# Patient Record
Sex: Male | Born: 1945 | Race: Black or African American | Hispanic: No | Marital: Single | Smoking: Never smoker
Health system: Southern US, Community
[De-identification: ages and names within clinical notes are randomized; demographics above are authoritative.]

## PROBLEM LIST (undated history)

## (undated) DIAGNOSIS — N529 Male erectile dysfunction, unspecified: Secondary | ICD-10-CM

## (undated) DIAGNOSIS — E119 Type 2 diabetes mellitus without complications: Secondary | ICD-10-CM

## (undated) DIAGNOSIS — E785 Hyperlipidemia, unspecified: Secondary | ICD-10-CM

## (undated) DIAGNOSIS — I10 Essential (primary) hypertension: Secondary | ICD-10-CM

## (undated) DIAGNOSIS — N183 Chronic kidney disease, stage 3 unspecified: Secondary | ICD-10-CM

## (undated) DIAGNOSIS — D649 Anemia, unspecified: Secondary | ICD-10-CM

## (undated) HISTORY — DX: Chronic kidney disease, stage 3 unspecified: N18.30

## (undated) HISTORY — DX: Anemia, unspecified: D64.9

## (undated) HISTORY — DX: Essential (primary) hypertension: I10

## (undated) HISTORY — DX: Male erectile dysfunction, unspecified: N52.9

## (undated) HISTORY — DX: Type 2 diabetes mellitus without complications: E11.9

## (undated) HISTORY — DX: Hyperlipidemia, unspecified: E78.5

## (undated) HISTORY — PX: PROSTATE SURGERY: SHX751

## (undated) HISTORY — DX: Hypercalcemia: E83.52

---

## 2008-03-26 ENCOUNTER — Ambulatory Visit: Payer: Self-pay | Admitting: Internal Medicine

## 2008-04-08 ENCOUNTER — Ambulatory Visit: Payer: Self-pay | Admitting: Internal Medicine

## 2012-05-14 DIAGNOSIS — D631 Anemia in chronic kidney disease: Secondary | ICD-10-CM | POA: Insufficient documentation

## 2012-05-14 DIAGNOSIS — D649 Anemia, unspecified: Secondary | ICD-10-CM | POA: Insufficient documentation

## 2012-12-04 ENCOUNTER — Emergency Department: Payer: Self-pay | Admitting: Emergency Medicine

## 2012-12-09 ENCOUNTER — Emergency Department: Payer: Self-pay | Admitting: Emergency Medicine

## 2012-12-09 LAB — BASIC METABOLIC PANEL
Anion Gap: 8 (ref 7–16)
Co2: 24 mmol/L (ref 21–32)
Creatinine: 2.97 mg/dL — ABNORMAL HIGH (ref 0.60–1.30)
EGFR (Non-African Amer.): 21 — ABNORMAL LOW
Potassium: 4.3 mmol/L (ref 3.5–5.1)
Sodium: 139 mmol/L (ref 136–145)

## 2012-12-09 LAB — CBC WITH DIFFERENTIAL/PLATELET
Basophil #: 0 10*3/uL (ref 0.0–0.1)
Eosinophil %: 1.2 %
HCT: 31.4 % — ABNORMAL LOW (ref 40.0–52.0)
HGB: 10.5 g/dL — ABNORMAL LOW (ref 13.0–18.0)
Lymphocyte #: 1 10*3/uL (ref 1.0–3.6)
Lymphocyte %: 24.3 %
MCH: 29.9 pg (ref 26.0–34.0)
Monocyte #: 0.3 x10 3/mm (ref 0.2–1.0)
Neutrophil %: 64.6 %
Platelet: 230 10*3/uL (ref 150–440)
RBC: 3.51 10*6/uL — ABNORMAL LOW (ref 4.40–5.90)
WBC: 4 10*3/uL (ref 3.8–10.6)

## 2012-12-09 LAB — URINALYSIS, COMPLETE
Bilirubin,UR: NEGATIVE
Blood: NEGATIVE
Glucose,UR: NEGATIVE mg/dL (ref 0–75)
Ketone: NEGATIVE
Nitrite: NEGATIVE
Protein: NEGATIVE
RBC,UR: 1 /HPF (ref 0–5)
WBC UR: 2 /HPF (ref 0–5)

## 2012-12-20 ENCOUNTER — Emergency Department: Payer: Self-pay | Admitting: Emergency Medicine
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
## 2019-12-26 ENCOUNTER — Ambulatory Visit: Payer: Medicare Other | Admitting: Gastroenterology

## 2020-01-30 ENCOUNTER — Encounter: Payer: Self-pay | Admitting: Gastroenterology

## 2020-01-30 ENCOUNTER — Other Ambulatory Visit: Payer: Self-pay

## 2020-01-30 ENCOUNTER — Ambulatory Visit (INDEPENDENT_AMBULATORY_CARE_PROVIDER_SITE_OTHER): Payer: Medicare Other | Admitting: Gastroenterology

## 2020-01-30 VITALS — BP 152/78 | HR 75 | Temp 98.2°F | Wt 152.4 lb

## 2020-01-30 DIAGNOSIS — I10 Essential (primary) hypertension: Secondary | ICD-10-CM | POA: Insufficient documentation

## 2020-01-30 DIAGNOSIS — E119 Type 2 diabetes mellitus without complications: Secondary | ICD-10-CM | POA: Insufficient documentation

## 2020-01-30 DIAGNOSIS — N183 Chronic kidney disease, stage 3 unspecified: Secondary | ICD-10-CM | POA: Insufficient documentation

## 2020-01-30 DIAGNOSIS — D500 Iron deficiency anemia secondary to blood loss (chronic): Secondary | ICD-10-CM | POA: Diagnosis not present

## 2020-01-30 NOTE — Progress Notes (Signed)
Gastroenterology Consultation  Referring Provider:     Casilda Carls, MD Primary Care Physician:  Casilda Carls, MD Primary Gastroenterologist:  Dr. Allen Norris     Reason for Consultation:     Iron deficiency anemia        HPI:   Donald Davis is a 74 y.o. y/o male referred for consultation & management of iron deficiency anemia by Dr. Casilda Carls, MD.  This patient comes in today after being sent by his primary care physician with a diagnosis of iron deficiency anemia.  It was also stated in the record that the patient had his last colonoscopy in 2013 and at that time was recommended to have a repeat colonoscopy in 5 years.  It is unclear what the last colonoscopy showed since it was done at an outside facility without any records in the chart.  In January of this year the patient's hemoglobin was 11.8 which is slightly lower than the lower limit of normal.  The patient denies any unexplained weight loss fevers chills nausea vomiting black stools or bloody stools.  He reports that he has been in general good health.  Past Medical History:  Diagnosis Date  . Anemia   . Chronic kidney disease (CKD), stage III (moderate)   . Diabetes mellitus ([place])   . Erectile dysfunction   . Hypercalcemia   . Hyperlipidemia   . Hypertension     Past Surgical History:  Procedure Laterality Date  . PROSTATE SURGERY      Prior to Admission medications   Not on File    Family History  Problem Relation Age of Onset  . Cancer Father      Social History   Tobacco Use  . Smoking status: Never Smoker  . Smokeless tobacco: Never Used  Substance Use Topics  . Alcohol use: Never  . Drug use: Never    Allergies as of 01/30/2020 - Review Complete 01/30/2020  Allergen Reaction Noted  . [place] grass extract  10/14/2013  . Hydrochlorothiazide  11/23/2015    Review of Systems:    All systems reviewed and negative except where noted in HPI.   Physical Exam:  BP (!) 152/78   Pulse 75    Temp 98.2 F (36.8 C) (Oral)   Wt 152 lb 6.4 oz (69.1 kg)  No LMP for male patient. General:   Alert,  Well-developed, well-nourished, pleasant and cooperative in NAD Head:  Normocephalic and atraumatic. Eyes:  Sclera clear, no icterus.   Conjunctiva pink. Ears:  Normal auditory acuity. Neck:  Supple; no masses or thyromegaly. Lungs:  Respirations even and unlabored.  Clear throughout to auscultation.   No wheezes, crackles, or rhonchi. No acute distress. Heart:  Regular rate and rhythm; no murmurs, clicks, rubs, or gallops. Abdomen:  Normal bowel sounds.  No bruits.  Soft, non-tender and non-distended without masses, hepatosplenomegaly or hernias noted.  No guarding or rebound tenderness.  Negative Carnett sign.   Rectal:  Deferred.  Pulses:  Normal pulses noted. Extremities:  No clubbing or edema.  No cyanosis. Neurologic:  Alert and oriented x3;  grossly normal neurologically. Skin:  Intact without significant lesions or rashes.  No jaundice. Lymph Nodes:  No significant cervical adenopathy. Psych:  Alert and cooperative. Normal mood and affect.  Imaging Studies: No results found.  Assessment and Plan:   Donald Davis is a 74 y.o. y/o male who comes in with a report of iron deficiency anemia.  The patient was reported to have had a colonoscopy  in 2013 and was recommended to have a repeat colonoscopy in 5 years.  The patient denies having polyps or any family history of colon cancer or colon polyps in the past and he is not sure why he was recommended to have a repeat colonoscopy in 5 years.  That being said for the anemia work-up the patient will be set up for an EGD and colonoscopy.  The patient has been explained the plan and agrees with it.    Lucilla Lame, MD. Marval Regal    Note: This dictation was prepared with Dragon dictation along with smaller phrase technology. Any transcriptional errors that result from this process are unintentional.

## 2020-01-30 NOTE — H&P (View-Only) (Signed)
Gastroenterology Consultation  Referring Provider:     Casilda Carls, MD Primary Care Physician:  Casilda Carls, MD Primary Gastroenterologist:  Dr. Allen Norris     Reason for Consultation:     Iron deficiency anemia        HPI:   Donald Davis is a 74 y.o. y/o male referred for consultation & management of iron deficiency anemia by Dr. Casilda Carls, MD.  This patient comes in today after being sent by his primary care physician with a diagnosis of iron deficiency anemia.  It was also stated in the record that the patient had his last colonoscopy in 2013 and at that time was recommended to have a repeat colonoscopy in 5 years.  It is unclear what the last colonoscopy showed since it was done at an outside facility without any records in the chart.  In January of this year the patient's hemoglobin was 11.8 which is slightly lower than the lower limit of normal.  The patient denies any unexplained weight loss fevers chills nausea vomiting black stools or bloody stools.  He reports that he has been in general good health.  Past Medical History:  Diagnosis Date  . Anemia   . Chronic kidney disease (CKD), stage III (moderate)   . Diabetes mellitus ([place])   . Erectile dysfunction   . Hypercalcemia   . Hyperlipidemia   . Hypertension     Past Surgical History:  Procedure Laterality Date  . PROSTATE SURGERY      Prior to Admission medications   Not on File    Family History  Problem Relation Age of Onset  . Cancer Father      Social History   Tobacco Use  . Smoking status: Never Smoker  . Smokeless tobacco: Never Used  Substance Use Topics  . Alcohol use: Never  . Drug use: Never    Allergies as of 01/30/2020 - Review Complete 01/30/2020  Allergen Reaction Noted  . [place] grass extract  10/14/2013  . Hydrochlorothiazide  11/23/2015    Review of Systems:    All systems reviewed and negative except where noted in HPI.   Physical Exam:  BP (!) 152/78   Pulse 75    Temp 98.2 F (36.8 C) (Oral)   Wt 152 lb 6.4 oz (69.1 kg)  No LMP for male patient. General:   Alert,  Well-developed, well-nourished, pleasant and cooperative in NAD Head:  Normocephalic and atraumatic. Eyes:  Sclera clear, no icterus.   Conjunctiva pink. Ears:  Normal auditory acuity. Neck:  Supple; no masses or thyromegaly. Lungs:  Respirations even and unlabored.  Clear throughout to auscultation.   No wheezes, crackles, or rhonchi. No acute distress. Heart:  Regular rate and rhythm; no murmurs, clicks, rubs, or gallops. Abdomen:  Normal bowel sounds.  No bruits.  Soft, non-tender and non-distended without masses, hepatosplenomegaly or hernias noted.  No guarding or rebound tenderness.  Negative Carnett sign.   Rectal:  Deferred.  Pulses:  Normal pulses noted. Extremities:  No clubbing or edema.  No cyanosis. Neurologic:  Alert and oriented x3;  grossly normal neurologically. Skin:  Intact without significant lesions or rashes.  No jaundice. Lymph Nodes:  No significant cervical adenopathy. Psych:  Alert and cooperative. Normal mood and affect.  Imaging Studies: No results found.  Assessment and Plan:   Donald Davis is a 74 y.o. y/o male who comes in with a report of iron deficiency anemia.  The patient was reported to have had a colonoscopy  in 2013 and was recommended to have a repeat colonoscopy in 5 years.  The patient denies having polyps or any family history of colon cancer or colon polyps in the past and he is not sure why he was recommended to have a repeat colonoscopy in 5 years.  That being said for the anemia work-up the patient will be set up for an EGD and colonoscopy.  The patient has been explained the plan and agrees with it.    Lucilla Lame, MD. Marval Regal    Note: This dictation was prepared with Dragon dictation along with smaller phrase technology. Any transcriptional errors that result from this process are unintentional.

## 2020-02-03 ENCOUNTER — Other Ambulatory Visit: Payer: Self-pay

## 2020-02-03 MED ORDER — SUPREP BOWEL PREP KIT 17.5-3.13-1.6 GM/177ML PO SOLN: 1.0000 | ORAL | 0 refills | Status: DC

## 2020-02-12 ENCOUNTER — Other Ambulatory Visit
Admission: RE | Admit: 2020-02-12 | Discharge: 2020-02-12 | Disposition: A | Discharge status: Home / Self Care | Payer: Medicare Other | Source: Ambulatory Visit | Attending: Gastroenterology | Admitting: Gastroenterology

## 2020-02-12 ENCOUNTER — Other Ambulatory Visit: Payer: Self-pay

## 2020-02-12 DIAGNOSIS — Z20822 Contact with and (suspected) exposure to covid-19: Secondary | ICD-10-CM | POA: Diagnosis not present

## 2020-02-12 DIAGNOSIS — Z01812 Encounter for preprocedural laboratory examination: Secondary | ICD-10-CM | POA: Insufficient documentation

## 2020-02-12 LAB — SARS CORONAVIRUS 2 (TAT 6-24 HRS): SARS Coronavirus 2: NEGATIVE

## 2020-02-14 ENCOUNTER — Ambulatory Visit
Admission: RE | Admit: 2020-02-14 | Discharge: 2020-02-14 | Disposition: A | Discharge status: Home / Self Care | Payer: Medicare Other | Attending: Gastroenterology | Admitting: Gastroenterology

## 2020-02-14 ENCOUNTER — Other Ambulatory Visit: Payer: Self-pay

## 2020-02-14 ENCOUNTER — Ambulatory Visit: Payer: Medicare Other | Admitting: Anesthesiology

## 2020-02-14 ENCOUNTER — Encounter
Admission: RE | Disposition: A | Discharge status: Home / Self Care | Payer: Self-pay | Source: Home / Self Care | Attending: Gastroenterology

## 2020-02-14 ENCOUNTER — Encounter: Payer: Self-pay | Admitting: Gastroenterology

## 2020-02-14 DIAGNOSIS — Z888 Allergy status to other drugs, medicaments and biological substances status: Secondary | ICD-10-CM | POA: Insufficient documentation

## 2020-02-14 DIAGNOSIS — K2900 Acute gastritis without bleeding: Secondary | ICD-10-CM | POA: Diagnosis not present

## 2020-02-14 DIAGNOSIS — D122 Benign neoplasm of ascending colon: Secondary | ICD-10-CM | POA: Insufficient documentation

## 2020-02-14 DIAGNOSIS — K2960 Other gastritis without bleeding: Secondary | ICD-10-CM | POA: Insufficient documentation

## 2020-02-14 DIAGNOSIS — D509 Iron deficiency anemia, unspecified: Secondary | ICD-10-CM | POA: Diagnosis present

## 2020-02-14 DIAGNOSIS — N529 Male erectile dysfunction, unspecified: Secondary | ICD-10-CM | POA: Insufficient documentation

## 2020-02-14 DIAGNOSIS — E785 Hyperlipidemia, unspecified: Secondary | ICD-10-CM | POA: Insufficient documentation

## 2020-02-14 DIAGNOSIS — N183 Chronic kidney disease, stage 3 unspecified: Secondary | ICD-10-CM | POA: Insufficient documentation

## 2020-02-14 DIAGNOSIS — K319 Disease of stomach and duodenum, unspecified: Secondary | ICD-10-CM | POA: Insufficient documentation

## 2020-02-14 DIAGNOSIS — K635 Polyp of colon: Secondary | ICD-10-CM | POA: Diagnosis not present

## 2020-02-14 DIAGNOSIS — E1122 Type 2 diabetes mellitus with diabetic chronic kidney disease: Secondary | ICD-10-CM | POA: Insufficient documentation

## 2020-02-14 DIAGNOSIS — I129 Hypertensive chronic kidney disease with stage 1 through stage 4 chronic kidney disease, or unspecified chronic kidney disease: Secondary | ICD-10-CM | POA: Diagnosis not present

## 2020-02-14 DIAGNOSIS — D500 Iron deficiency anemia secondary to blood loss (chronic): Secondary | ICD-10-CM

## 2020-02-14 HISTORY — PX: COLONOSCOPY WITH PROPOFOL: SHX5780

## 2020-02-14 HISTORY — PX: ESOPHAGOGASTRODUODENOSCOPY (EGD) WITH PROPOFOL: SHX5813

## 2020-02-14 SURGERY — COLONOSCOPY WITH PROPOFOL
Anesthesia: General

## 2020-02-14 MED ORDER — PROPOFOL 500 MG/50ML IV EMUL
INTRAVENOUS | Status: DC | PRN
  Administered 2020-02-14: 150 ug/kg/min via INTRAVENOUS

## 2020-02-14 MED ORDER — PROPOFOL 10 MG/ML IV BOLUS
INTRAVENOUS | Status: DC | PRN
  Administered 2020-02-14: 100 mg via INTRAVENOUS

## 2020-02-14 MED ORDER — SODIUM CHLORIDE 0.9 % IV SOLN
INTRAVENOUS | Status: DC
  Administered 2020-02-14: 1000 mL via INTRAVENOUS

## 2020-02-14 MED ORDER — LIDOCAINE HCL (PF) 2 % IJ SOLN
INTRAMUSCULAR | Status: AC
  Filled 2020-02-14: qty 5

## 2020-02-14 MED ORDER — PANTOPRAZOLE SODIUM 40 MG PO TBEC
40.0000 mg | DELAYED_RELEASE_TABLET | Freq: Every day | ORAL | 1 refills | Status: DC
Start: 2020-02-14 — End: 2020-02-17

## 2020-02-14 MED ORDER — LIDOCAINE HCL (CARDIAC) PF 100 MG/5ML IV SOSY
PREFILLED_SYRINGE | INTRAVENOUS | Status: DC | PRN
  Administered 2020-02-14: 50 mg via INTRAVENOUS

## 2020-02-14 MED ORDER — PROPOFOL 500 MG/50ML IV EMUL
INTRAVENOUS | Status: AC
  Filled 2020-02-14: qty 50

## 2020-02-14 NOTE — Op Note (Addendum)
[facility] Gastroenterology Patient Name: Donald Davis Procedure Date: 02/14/2020 10:36 AM MRN: [id number] Account #: [id number] Date of Birth: 11-Jan-1946 Admit Type: Outpatient Age: 74 Room: [facility] ENDO ROOM 4 Gender: Male Note Status: Finalized Procedure:             Upper GI endoscopy Indications:           Iron deficiency anemia Providers:             Lucilla Lame MD, MD Referring MD:          Casilda Carls, MD (Referring MD) Medicines:             Propofol per Anesthesia Complications:         No immediate complications. Procedure:             Pre-Anesthesia Assessment:                        - Prior to the procedure, a History and Physical was                         performed, and patient medications and allergies were                         reviewed. The patient's tolerance of previous                         anesthesia was also reviewed. The risks and benefits                         of the procedure and the sedation options and risks                         were discussed with the patient. All questions were                         answered, and informed consent was obtained. Prior                         Anticoagulants: The patient has taken no previous                         anticoagulant or antiplatelet agents. ASA Grade                         Assessment: II - A patient with mild systemic disease.                         After reviewing the risks and benefits, the patient                         was deemed in satisfactory condition to undergo the                         procedure.                        After obtaining informed consent, the endoscope was  passed under direct vision. Throughout the procedure,                         the patient's blood pressure, pulse, and oxygen                         saturations were monitored continuously. The Endoscope                         was introduced through the mouth, and advanced  to the                         second part of duodenum. The upper GI endoscopy was                         accomplished without difficulty. The patient tolerated                         the procedure well. Findings:      The examined esophagus was normal.      Scattered severe inflammation characterized by erosions was found in the       entire examined stomach. Biopsies were taken with a cold forceps for       histology.      The examined duodenum was normal. Impression:            - Normal esophagus.                        - Erosive gastritis. Biopsied.                        - Normal examined duodenum. Recommendation:        - Discharge patient to home.                        - Resume previous diet.                        - Continue present medications.                        - Await pathology results.                        - Perform a colonoscopy today.                        - Use a proton pump inhibitor PO daily. Procedure Code(s):     --- Professional ---                        [id number], Esophagogastroduodenoscopy, flexible,                         transoral; with biopsy, single or multiple Diagnosis Code(s):     --- Professional ---                        D50.9, Iron deficiency anemia, unspecified                        K29.60,  Other gastritis without bleeding CPT copyright 2019 American Medical Association. All rights reserved. The codes documented in this report are preliminary and upon coder review may  be revised to meet current compliance requirements. Lucilla Lame MD, MD 02/14/2020 10:55:06 AM This report has been signed electronically. Number of Addenda: 0 Note Initiated On: 02/14/2020 10:36 AM Estimated Blood Loss:  Estimated blood loss: none.      [facility]

## 2020-02-14 NOTE — Interval H&P Note (Signed)
History and Physical Interval Note:  02/14/2020 12:40 PM  Donald Davis  has presented today for surgery, with the diagnosis of Iron deficiency anemia D50.0.  The various methods of treatment have been discussed with the patient and family. After consideration of risks, benefits and other options for treatment, the patient has consented to  Procedure(s): COLONOSCOPY WITH PROPOFOL (N/A) ESOPHAGOGASTRODUODENOSCOPY (EGD) WITH PROPOFOL (N/A) as a surgical intervention.  The patient's history has been reviewed, patient examined, no change in status, stable for surgery.  I have reviewed the patient's chart and labs.  Questions were answered to the patient's satisfaction.     Nitesh Pitstick [employer]

## 2020-02-14 NOTE — Transfer of Care (Signed)
Immediate Anesthesia Transfer of Care Note  Patient: Donald Davis  Procedure(s) Performed: COLONOSCOPY WITH PROPOFOL (N/A ) ESOPHAGOGASTRODUODENOSCOPY (EGD) WITH PROPOFOL (N/A )  Patient Location: Endoscopy Unit  Anesthesia Type:General  Level of Consciousness: drowsy and patient cooperative  Airway & Oxygen Therapy: Patient Spontanous Breathing  Post-op Assessment: Report given to RN and Post -op Vital signs reviewed and stable  Post vital signs: Reviewed and stable  Last Vitals:  Vitals Value Taken Time  BP 87/59 02/14/20 1112  Temp 36.1 C 02/14/20 1112  Pulse 87 02/14/20 1116  Resp 26 02/14/20 1116  SpO2 100 % 02/14/20 1116  Vitals shown include unvalidated device data.  Last Pain:  Vitals:   02/14/20 1112  TempSrc: Temporal  PainSc: Asleep         Complications: No apparent anesthesia complications

## 2020-02-14 NOTE — Anesthesia Preprocedure Evaluation (Signed)
Anesthesia Evaluation  Patient identified by MRN, date of birth, ID band Patient awake    Reviewed: Allergy & Precautions, H&P , NPO status , Patient's Chart, lab work & pertinent test results, reviewed documented beta blocker date and time   History of Anesthesia Complications Negative for: history of anesthetic complications  Airway Mallampati: I  TM Distance: >3 FB Neck ROM: full    Dental  (+) Dental Advidsory Given, Teeth Intact, Missing   Pulmonary neg pulmonary ROS,    Pulmonary exam normal breath sounds clear to auscultation       Cardiovascular Exercise Tolerance: Good hypertension, (-) angina(-) Past MI and (-) Cardiac Stents Normal cardiovascular exam(-) dysrhythmias + Valvular Problems/Murmurs  Rhythm:regular Rate:Normal     Neuro/Psych negative neurological ROS  negative psych ROS   GI/Hepatic negative GI ROS, Neg liver ROS,   Endo/Other  diabetes, Oral Hypoglycemic Agents  Renal/GU CRFRenal disease  negative genitourinary   Musculoskeletal   Abdominal   Peds  Hematology negative hematology ROS (+)   Anesthesia Other Findings Past Medical History: No date: Anemia No date: Chronic kidney disease (CKD), stage III (moderate) No date: Diabetes mellitus ([facility]) No date: Erectile dysfunction No date: Hypercalcemia No date: Hyperlipidemia No date: Hypertension   Reproductive/Obstetrics negative OB ROS                             Anesthesia Physical Anesthesia Plan  ASA: II  Anesthesia Plan: General   Post-op Pain Management:    Induction: Intravenous  PONV Risk Score and Plan: 2 and Propofol infusion and TIVA  Airway Management Planned: Natural Airway and Nasal Cannula  Additional Equipment:   Intra-op Plan:   Post-operative Plan:   Informed Consent: I have reviewed the patients History and Physical, chart, labs and discussed the procedure including the risks,  benefits and alternatives for the proposed anesthesia with the patient or authorized representative who has indicated his/her understanding and acceptance.     Dental Advisory Given  Plan Discussed with: Anesthesiologist, CRNA and Surgeon  Anesthesia Plan Comments:         Anesthesia Quick Evaluation

## 2020-02-14 NOTE — Anesthesia Postprocedure Evaluation (Signed)
Anesthesia Post Note  Patient: Donald Davis  Procedure(s) Performed: COLONOSCOPY WITH PROPOFOL (N/A ) ESOPHAGOGASTRODUODENOSCOPY (EGD) WITH PROPOFOL (N/A )  Patient location during evaluation: Endoscopy Anesthesia Type: General Level of consciousness: awake and alert Pain management: pain level controlled Vital Signs Assessment: post-procedure vital signs reviewed and stable Respiratory status: spontaneous breathing, nonlabored ventilation, respiratory function stable and patient connected to nasal cannula oxygen Cardiovascular status: blood pressure returned to baseline and stable Postop Assessment: no apparent nausea or vomiting Anesthetic complications: no     Last Vitals:  Vitals:   02/14/20 1122 02/14/20 1132  BP: (!) 83/60 91/63  Pulse: 95 86  Resp: 16 (!) 24  Temp:    SpO2: 100% 100%    Last Pain:  Vitals:   02/14/20 1132  TempSrc:   PainSc: 0-No pain                 Martha Clan

## 2020-02-14 NOTE — Op Note (Addendum)
[facility] Gastroenterology Patient Name: Donald Davis Procedure Date: 02/14/2020 10:36 AM MRN: [id number] Account #: [id number] Date of Birth: 1946-04-21 Admit Type: Outpatient Age: 74 Room: [facility] ENDO ROOM 4 Gender: Male Note Status: Finalized Procedure:             Colonoscopy Indications:           Iron deficiency anemia Providers:             Lucilla Lame MD, MD Referring MD:          Casilda Carls, MD (Referring MD) Medicines:             Propofol per Anesthesia Complications:         No immediate complications. Procedure:             Pre-Anesthesia Assessment:                        - Prior to the procedure, a History and Physical was                         performed, and patient medications and allergies were                         reviewed. The patient's tolerance of previous                         anesthesia was also reviewed. The risks and benefits                         of the procedure and the sedation options and risks                         were discussed with the patient. All questions were                         answered, and informed consent was obtained. Prior                         Anticoagulants: The patient has taken no previous                         anticoagulant or antiplatelet agents. ASA Grade                         Assessment: II - A patient with mild systemic disease.                         After reviewing the risks and benefits, the patient                         was deemed in satisfactory condition to undergo the                         procedure.                        After obtaining informed consent, the colonoscope was  passed under direct vision. Throughout the procedure,                         the patient's blood pressure, pulse, and oxygen                         saturations were monitored continuously. The                         Colonoscope was introduced through the anus and              advanced to the the cecum, identified by appendiceal                         orifice and ileocecal valve. The colonoscopy was                         performed without difficulty. The patient tolerated                         the procedure well. The quality of the bowel                         preparation was excellent. Findings:      The perianal and digital rectal examinations were normal.      A 3 mm polyp was found in the ascending colon. The polyp was sessile.       The polyp was removed with a cold biopsy forceps. Resection and       retrieval were complete.      Non-bleeding internal hemorrhoids were found during retroflexion. The       hemorrhoids were Grade I (internal hemorrhoids that do not prolapse). Impression:            - One 3 mm polyp in the ascending colon, removed with                         a cold biopsy forceps. Resected and retrieved.                        - Non-bleeding internal hemorrhoids. Recommendation:        - Discharge patient to home.                        - Resume previous diet.                        - Continue present medications.                        - Await pathology results.                        - Repeat colonoscopy in 5 years if polyp adenoma and                         10 years if hyperplastic Procedure Code(s):     --- Professional ---                        [phone], Colonoscopy, flexible; with biopsy,  single or                         multiple Diagnosis Code(s):     --- Professional ---                        D50.9, Iron deficiency anemia, unspecified                        K63.5, Polyp of colon CPT copyright 2019 American Medical Association. All rights reserved. The codes documented in this report are preliminary and upon coder review may  be revised to meet current compliance requirements. Lucilla Lame MD, MD 02/14/2020 11:08:55 AM This report has been signed electronically. Number of Addenda: 0 Note Initiated On: 02/14/2020  10:36 AM Scope Withdrawal Time: 0 hours 7 minutes 6 seconds  Total Procedure Duration: 0 hours 9 minutes 22 seconds  Estimated Blood Loss:  Estimated blood loss: none.      [facility]

## 2020-02-17 ENCOUNTER — Encounter: Payer: Self-pay | Admitting: Gastroenterology

## 2020-02-17 ENCOUNTER — Other Ambulatory Visit: Payer: Self-pay

## 2020-02-17 LAB — SURGICAL PATHOLOGY

## 2020-02-17 MED ORDER — PANTOPRAZOLE SODIUM 40 MG PO TBEC: 40.0000 mg | DELAYED_RELEASE_TABLET | Freq: Every day | ORAL | 1 refills | Status: DC

## 2020-04-26 ENCOUNTER — Other Ambulatory Visit: Payer: Self-pay | Admitting: Gastroenterology

## 2022-12-13 ENCOUNTER — Ambulatory Visit (INDEPENDENT_AMBULATORY_CARE_PROVIDER_SITE_OTHER): Payer: Medicare Other | Admitting: Urology

## 2022-12-13 VITALS — BP 159/73 | HR 64 | Ht 72.0 in | Wt 144.0 lb

## 2022-12-13 DIAGNOSIS — R300 Dysuria: Secondary | ICD-10-CM | POA: Diagnosis not present

## 2022-12-13 DIAGNOSIS — Z8546 Personal history of malignant neoplasm of prostate: Secondary | ICD-10-CM

## 2022-12-13 LAB — URINALYSIS, COMPLETE
Bilirubin, UA: NEGATIVE
Ketones, UA: NEGATIVE
Leukocytes,UA: NEGATIVE
Nitrite, UA: NEGATIVE
RBC, UA: NEGATIVE
Specific Gravity, UA: 1.015 (ref 1.005–1.030)
Urobilinogen, Ur: 0.2 mg/dL (ref 0.2–1.0)
pH, UA: 5.5 (ref 5.0–7.5)

## 2022-12-13 LAB — MICROSCOPIC EXAMINATION: Bacteria, UA: NONE SEEN

## 2022-12-13 NOTE — Progress Notes (Signed)
I,Amy L Pierron,acting as a scribe for Hollice Espy, MD.,have documented all relevant documentation on the behalf of Hollice Espy, MD,as directed by  Hollice Espy, MD while in the presence of Hollice Espy, MD.  12/13/2022 10:19 AM   Donald Davis 1946/03/02 [id number]  Referring provider: Casilda Carls, MD [place],  [place] [address]  Chief Complaint  Patient presents with   Establish Care    Prostate     HPI: 77 year-old male who presents to establish care for history of prostate cancer.  He's referred by his PCP, Dr. Rosario Jacks. His PSA was last checked in April 2023 and was undetectable. His labs dating back to 2015 indicate it remained undetectable. The notes indicate he is status post prostatectomy which he describes as open retropubic approach in the extreme remote past, date unknown.Marland Kitchen He was a former patient of Dr. Lottie Rater, based on historical notes from [place] at [facility].  He is unsure of the reason for the current referral. He had his prostate removed many years ago. He reports occasional burning with urination. He denies hematuria, leakage, and ED.     PMH: Past Medical History:  Diagnosis Date   Anemia    Chronic kidney disease (CKD), stage III (moderate)    Diabetes mellitus ([place])    Erectile dysfunction    Hypercalcemia    Hyperlipidemia    Hypertension     Surgical History: Past Surgical History:  Procedure Laterality Date   COLONOSCOPY WITH PROPOFOL N/A 02/14/2020   Procedure: COLONOSCOPY WITH PROPOFOL;  Surgeon: Lucilla Lame, MD;  Location: [facility] ENDOSCOPY;  Service: Endoscopy;  Laterality: N/A;   ESOPHAGOGASTRODUODENOSCOPY (EGD) WITH PROPOFOL N/A 02/14/2020   Procedure: ESOPHAGOGASTRODUODENOSCOPY (EGD) WITH PROPOFOL;  Surgeon: Lucilla Lame, MD;  Location: [facility] ENDOSCOPY;  Service: Endoscopy;  Laterality: N/A;   PROSTATE SURGERY      Home Medications:  Allergies as of 12/13/2022       Reactions   [place] Grass Extract     Other reaction(s): Other (See Comments) Runny nose and itchy eyes   Hydrochlorothiazide    hypercalcemia        Medication List        Accurate as of December 13, 2022 10:19 AM. If you have any questions, ask your nurse or doctor.          aspirin EC 81 MG tablet Take by mouth.   dorzolamide-timolol 2-0.5 % ophthalmic solution Commonly known as: COSOPT   ferrous sulfate 325 (65 FE) MG tablet Take by mouth.   hydrochlorothiazide 25 MG tablet Commonly known as: HYDRODIURIL   Jardiance 10 MG Tabs tablet Generic drug: empagliflozin   latanoprost 0.005 % ophthalmic solution Commonly known as: XALATAN   lisinopril 10 MG tablet Commonly known as: ZESTRIL   lovastatin 10 MG tablet Commonly known as: MEVACOR   metFORMIN 500 MG tablet Commonly known as: GLUCOPHAGE   metFORMIN 500 MG 24 hr tablet Commonly known as: GLUCOPHAGE-XR Take 500 mg by mouth every morning.   pantoprazole 40 MG tablet Commonly known as: PROTONIX TAKE 1 TABLET BY MOUTH EVERY DAY   propranolol 10 MG tablet Commonly known as: INDERAL   Suprep Bowel Prep Kit 17.5-3.13-1.6 GM/177ML Soln Generic drug: Na Sulfate-K Sulfate-Mg Sulf Take 1 kit by mouth as directed.        Allergies:  Allergies  Allergen Reactions   [place] Grass Extract     Other reaction(s): Other (See Comments) Runny nose and itchy eyes   Hydrochlorothiazide  hypercalcemia    Family History: Family History  Problem Relation Age of Onset   Cancer Father     Social History:  reports that he has never smoked. He has never used smokeless tobacco. He reports that he does not drink alcohol and does not use drugs.   Physical Exam: BP (!) 159/73   Pulse 64   Ht 6' (1.829 m)   Wt 144 lb (65.3 kg)   BMI 19.53 kg/m   Constitutional:  Alert and oriented, No acute distress. HEENT: [place] AT, moist mucus membranes.  Trachea midline, no masses. Neurologic: Grossly intact, no focal deficits, moving all 4  extremities. Psychiatric: Normal mood and affect.   Assessment & Plan:    History of prostate cancer  - Status post prostatectomy. PSA undetectable as of 01/2022  - He is unsure of the reason for the current referral; history of prostate cancer was remote and his PSA has been undetectable for presumably decades  - Plan to check PSA levels and analyze urine sample. If PSA levels remain undetectable and urine analysis is unremarkable, a note will be sent to Dr. Rosario Jacks indicating no need for urology follow-up unless new problems arise.  2. Dysuria  - Evaluate results of UA to determine possible cause of this. -Occasional  Return if symptoms worsen or fail to improve.  I have reviewed the above documentation for accuracy and completeness, and I agree with the above.   Hollice Espy, MD    [facility] [address] [place], [place] [address] [phone]

## 2022-12-14 ENCOUNTER — Telehealth: Payer: Self-pay

## 2022-12-14 LAB — PSA: Prostate Specific Ag, Serum: <0.1 ng/mL (ref 0.0–4.0)

## 2022-12-14 NOTE — Telephone Encounter (Signed)
Hollice Espy, MD  Kris Mouton, CMA PSA is undetectable, we will see you as needed  Hollice Espy, MD  Left message for patient to call back

## 2022-12-15 NOTE — Telephone Encounter (Signed)
Pt called, I gave him the message from Dr. Erlene Quan. Pt understood.

## 2023-11-20 LAB — MICROALBUMIN / CREATININE URINE RATIO: Microalb Creat Ratio: 21.88

## 2023-11-20 LAB — PROTEIN / CREATININE RATIO, URINE
Creatinine, Urine: 146.7
Creatinine, Urine: 147.6

## 2024-04-17 ENCOUNTER — Encounter: Payer: Self-pay | Admitting: Internal Medicine

## 2024-05-17 ENCOUNTER — Ambulatory Visit (INDEPENDENT_AMBULATORY_CARE_PROVIDER_SITE_OTHER): Payer: PRIVATE HEALTH INSURANCE | Admitting: Physician Assistant

## 2024-05-17 VITALS — BP 158/75 | HR 72 | Ht 71.0 in | Wt 143.0 lb

## 2024-05-17 DIAGNOSIS — Z8546 Personal history of malignant neoplasm of prostate: Secondary | ICD-10-CM | POA: Diagnosis not present

## 2024-05-17 NOTE — Progress Notes (Signed)
 05/17/2024 9:44 PM   Donald Davis March 31, 1946 [id number]  CC: Chief Complaint  Patient presents with   Follow-up   Establish Care   HPI: Donald Davis is a 78 y.o. male with PMH remote prostate cancer s/p prostatectomy with undetectable PSA who presents today for follow up.   Today he reports his PCP, Dr. Weyman, recently retired and recommended he follow up with each of his specialist teams to make sure he is up to date with them. He has no acute concerns today and denies bothersome voiding symptoms, dysuria, flank pain, or gross hematuria.  He has a history of CKD and is up to date with nephrology follow ups.  PMH: Past Medical History:  Diagnosis Date   Anemia    Chronic kidney disease (CKD), stage III (moderate)    Diabetes mellitus (HCC)    Erectile dysfunction    Hypercalcemia    Hyperlipidemia    Hypertension     Surgical History: Past Surgical History:  Procedure Laterality Date   COLONOSCOPY WITH PROPOFOL  N/A 02/14/2020   Procedure: COLONOSCOPY WITH PROPOFOL ;  Surgeon: Jinny Carmine, MD;  Location: [facility] ENDOSCOPY;  Service: Endoscopy;  Laterality: N/A;   ESOPHAGOGASTRODUODENOSCOPY (EGD) WITH PROPOFOL  N/A 02/14/2020   Procedure: ESOPHAGOGASTRODUODENOSCOPY (EGD) WITH PROPOFOL ;  Surgeon: Jinny Carmine, MD;  Location: [facility] ENDOSCOPY;  Service: Endoscopy;  Laterality: N/A;   PROSTATE SURGERY      Home Medications:  Allergies as of 05/17/2024       Reactions   [place] Grass Extract    Other reaction(s): Other (See Comments) Runny nose and itchy eyes   Hydrochlorothiazide    hypercalcemia        Medication List        Accurate as of May 17, 2024  9:44 PM. If you have any questions, ask your nurse or doctor.          amLODipine 10 MG tablet Commonly known as: NORVASC Take 10 mg by mouth daily.   aspirin EC 81 MG tablet Take by mouth.   atorvastatin 10 MG tablet Commonly known as: LIPITOR Take 10 mg by mouth daily.   dorzolamide-timolol  2-0.5 % ophthalmic solution Commonly known as: COSOPT   ferrous sulfate 325 (65 FE) MG tablet Take by mouth.   hydrochlorothiazide 25 MG tablet Commonly known as: HYDRODIURIL   Jardiance 10 MG Tabs tablet Generic drug: empagliflozin   latanoprost 0.005 % ophthalmic solution Commonly known as: XALATAN   lisinopril 10 MG tablet Commonly known as: ZESTRIL   lovastatin 10 MG tablet Commonly known as: MEVACOR   metFORMIN 500 MG tablet Commonly known as: GLUCOPHAGE   metFORMIN 500 MG 24 hr tablet Commonly known as: GLUCOPHAGE-XR Take 500 mg by mouth every morning.   pantoprazole  40 MG tablet Commonly known as: PROTONIX  TAKE 1 TABLET BY MOUTH EVERY DAY   propranolol 10 MG tablet Commonly known as: INDERAL   Suprep Bowel Prep  Kit 17.5-3.13-1.6 GM/177ML Soln Generic drug: Na Sulfate-K Sulfate-Mg Sulfate concentrate Take 1 kit by mouth as directed.        Allergies:  Allergies  Allergen Reactions   [place] Grass Extract     Other reaction(s): Other (See Comments) Runny nose and itchy eyes   Hydrochlorothiazide     hypercalcemia    Family History: Family History  Problem Relation Age of Onset   Cancer Father     Social History:   reports that he has never smoked. He has never used smokeless tobacco. He reports that he does not  drink alcohol and does not use drugs.  Physical Exam: BP (!) 158/75 (BP Location: Left Arm, Patient Position: Sitting, Cuff Size: Normal)   Pulse 72   Ht 5' 11 (1.803 m)   Wt 143 lb (64.9 kg)   SpO2 99%   BMI 19.94 kg/m   Constitutional:  Alert and oriented, no acute distress, nontoxic appearing HEENT: [place], AT Cardiovascular: No clubbing, cyanosis, or edema Respiratory: Normal respiratory effort, no increased work of breathing Skin: No rashes, bruises or suspicious lesions Neurologic: Grossly intact, no focal deficits, moving all 4 extremities Psychiatric: Normal mood and affect  Assessment & Plan:   1. History of prostate  cancer (Primary) Remote, PSA undetectable for many years. Most recent PSA was again undetectable. No new voiding symptoms. Ok to discontinue PSA monitoring and follow up with us  as needed.  Return if symptoms worsen or fail to improve.  Lucie Hones, PA-C  [facility]  [address] [place], [place] [address] [phone]

## 2024-07-16 ENCOUNTER — Encounter: Payer: Self-pay | Admitting: Internal Medicine

## 2024-07-16 ENCOUNTER — Ambulatory Visit (INDEPENDENT_AMBULATORY_CARE_PROVIDER_SITE_OTHER): Admitting: Internal Medicine

## 2024-07-16 VITALS — BP 132/68 | HR 75 | Ht 71.0 in | Wt 142.8 lb

## 2024-07-16 DIAGNOSIS — E782 Mixed hyperlipidemia: Secondary | ICD-10-CM

## 2024-07-16 DIAGNOSIS — D509 Iron deficiency anemia, unspecified: Secondary | ICD-10-CM

## 2024-07-16 DIAGNOSIS — E1169 Type 2 diabetes mellitus with other specified complication: Secondary | ICD-10-CM | POA: Insufficient documentation

## 2024-07-16 DIAGNOSIS — N1831 Chronic kidney disease, stage 3a: Secondary | ICD-10-CM | POA: Diagnosis not present

## 2024-07-16 DIAGNOSIS — I152 Hypertension secondary to endocrine disorders: Secondary | ICD-10-CM

## 2024-07-16 DIAGNOSIS — E119 Type 2 diabetes mellitus without complications: Secondary | ICD-10-CM

## 2024-07-16 DIAGNOSIS — E1159 Type 2 diabetes mellitus with other circulatory complications: Secondary | ICD-10-CM | POA: Insufficient documentation

## 2024-07-16 LAB — POCT CBG (FASTING - GLUCOSE)-MANUAL ENTRY: Glucose Fasting, POC: 143 mg/dL — AB (ref 70–99)

## 2024-07-16 NOTE — Progress Notes (Signed)
 New Patient Office Visit  Subjective   Patient ID: Donald Davis, male    DOB: 1946/06/02  Age: 78 y.o. MRN: [id number]  CC:  Chief Complaint  Patient presents with   Establish Care    NPE    HPI Donald Davis presents to establish care Previous Primary Care provider/office: Dr. Jadali  he does not have additional concerns to discuss today.   Patient is here today to establish care with office as his PCP. He reports he is doing well and has no complaints at this time. Patient has PMH that includes: Hx prostate cancer and s/p prostatectomy and follows Urology. 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 Patient is due for routine fasting blood work today. He has already gotten his flu shot for the season and reports going to get covid vaccines soon.  Last colonoscopy 2021 and is due 2026 for tubular adenomas that were removed. Patient reports fasting blood glucose at home range 130-140s. Last HbgA1c was 7.3%.  PHQ-9 score was 0; GAD-7 score was 2.  Denies headache, chest pain, shortness of breath, abdominal pain, nausea, vomiting, diarrhea, constipation at this time.    Outpatient Encounter Medications as of 07/16/2024  Medication Sig   amLODipine (NORVASC) 10 MG tablet Take 10 mg by mouth daily.   aspirin 81 MG EC tablet Take by mouth.   atorvastatin (LIPITOR) 10 MG tablet Take 10 mg by mouth daily.   dorzolamide-timolol (COSOPT) 22.3-6.8 MG/ML ophthalmic solution    hydrochlorothiazide (HYDRODIURIL) 25 MG tablet    JARDIANCE 10 MG TABS tablet    latanoprost (XALATAN) 0.005 % ophthalmic solution    lisinopril (ZESTRIL) 40 MG tablet Take 40 mg by mouth daily.   metFORMIN (GLUCOPHAGE-XR) 500 MG 24 hr tablet Take 500 mg by mouth every morning. (Patient taking differently: Take 500 mg by mouth 2 (two) times daily with a meal.)   lovastatin  (MEVACOR) 10 MG tablet  (Patient not taking: Reported on 07/16/2024)   [DISCONTINUED] ferrous sulfate 325 (65 FE) MG tablet Take by mouth. (Patient not taking: Reported on 07/16/2024)   [DISCONTINUED] lisinopril (ZESTRIL) 10 MG tablet  (Patient not taking: Reported on 07/16/2024)   [DISCONTINUED] metFORMIN (GLUCOPHAGE) 500 MG tablet  (Patient not taking: Reported on 07/16/2024)   [DISCONTINUED] Na Sulfate-K Sulfate-Mg Sulf (SUPREP BOWEL PREP  KIT) 17.5-3.13-1.6 GM/177ML SOLN Take 1 kit by mouth as directed. (Patient not taking: Reported on 07/16/2024)   [DISCONTINUED] pantoprazole  (PROTONIX ) 40 MG tablet TAKE 1 TABLET BY MOUTH EVERY DAY (Patient not taking: Reported on 07/16/2024)   [DISCONTINUED] propranolol (INDERAL) 10 MG tablet  (Patient not taking: Reported on 07/16/2024)   No facility-administered encounter medications on file as of 07/16/2024.    Past Medical History:  Diagnosis Date   Anemia    Chronic kidney disease (CKD), stage III (moderate) (HCC)    Diabetes mellitus (HCC)    Erectile dysfunction    Hypercalcemia    Hyperlipidemia    Hypertension     Past Surgical History:  Procedure Laterality Date   COLONOSCOPY WITH PROPOFOL  N/A 02/14/2020   Procedure: COLONOSCOPY WITH PROPOFOL ;  Surgeon: Jinny Carmine, MD;  Location: [facility] ENDOSCOPY;  Service: Endoscopy;  Laterality: N/A;   ESOPHAGOGASTRODUODENOSCOPY (EGD) WITH PROPOFOL  N/A 02/14/2020   Procedure: ESOPHAGOGASTRODUODENOSCOPY (EGD) WITH PROPOFOL ;  Surgeon: Jinny Carmine, MD;  Location: [facility] ENDOSCOPY;  Service: Endoscopy;  Laterality: N/A;   PROSTATE SURGERY  Family History  Problem Relation Age of Onset   Cancer Father     Social History   Socioeconomic History   Marital status: Single    Spouse name: Not on file   Number of children: Not on file   Years of education: Not on file   Highest education level: Not on file  Occupational History   Not on file  Tobacco Use   Smoking status: Never   Smokeless  tobacco: Never  Vaping Use   Vaping status: Never Used  Substance and Sexual Activity   Alcohol use: Never   Drug use: Never   Sexual activity: Not on file  Other Topics Concern   Not on file  Social History Narrative   Not on file   Social Drivers of Health   Financial Resource Strain: Not on file  Food Insecurity: Not on file  Transportation Needs: Not on file  Physical Activity: Not on file  Stress: Not on file  Social Connections: Not on file  Intimate Partner Violence: Not on file    Review of Systems  Constitutional: Negative.  Negative for chills, fever and malaise/fatigue.  HENT: Negative.  Negative for congestion and sore throat.   Eyes: Negative.  Negative for blurred vision and pain.  Respiratory: Negative.  Negative for cough and shortness of breath.   Cardiovascular: Negative.  Negative for chest pain, palpitations and leg swelling.  Gastrointestinal: Negative.  Negative for abdominal pain, blood in stool, constipation, diarrhea, heartburn, melena, nausea and vomiting.  Genitourinary: Negative.  Negative for dysuria, flank pain, frequency and urgency.  Musculoskeletal: Negative.  Negative for joint pain and myalgias.  Skin: Negative.   Neurological: Negative.  Negative for dizziness, tingling, sensory change, weakness and headaches.  Endo/Heme/Allergies: Negative.   Psychiatric/Behavioral: Negative.  Negative for depression and suicidal ideas. The patient is not nervous/anxious.         Objective   BP 132/68   Pulse 75   Ht 5' 11 (1.803 m)   Wt 142 lb 12.8 oz (64.8 kg)   SpO2 99%   BMI 19.92 kg/m   Physical Exam Vitals and nursing note reviewed.  Constitutional:      General: He is not in acute distress.    Appearance: Normal appearance. He is not ill-appearing.  HENT:     Head: Normocephalic and atraumatic.     Nose: Nose normal.     Mouth/Throat:     Mouth: Mucous membranes are moist.     Pharynx: Oropharynx is clear.  Eyes:      Conjunctiva/sclera: Conjunctivae normal.     Pupils: Pupils are equal, round, and reactive to light.  Cardiovascular:     Rate and Rhythm: Normal rate and regular rhythm.     Pulses: Normal pulses.     Heart sounds: Normal heart sounds.  Pulmonary:     Effort: Pulmonary effort is normal.     Breath sounds: Normal breath sounds. No wheezing or rhonchi.  Abdominal:     General: Bowel sounds are normal. There is no distension.     Palpations: Abdomen is soft.     Tenderness: There is no abdominal tenderness.  Musculoskeletal:        General: Normal range of motion.     Cervical back: Normal range of motion and neck supple.     Right lower leg: No edema.     Left lower leg: No edema.  Skin:    General: Skin is warm and dry.  Capillary Refill: Capillary refill takes less than 2 seconds.  Neurological:     General: No focal deficit present.     Mental Status: He is alert and oriented to person, place, and time.     Sensory: No sensory deficit.     Motor: No weakness.  Psychiatric:        Mood and Affect: Mood normal.        Behavior: Behavior normal.        Judgment: Judgment normal.        Assessment & Plan:  Collect routine fasting blood work today and FU with patient on results. Continue medications as prescribed. Keep specialists appointments as recommended. Problem List Items Addressed This Visit     Anemia   Relevant Orders   CBC with Diff   Chronic kidney disease, stage III (moderate) (HCC)   Relevant Orders   CBC with Diff   CMP14+EGFR   Diabetes (HCC) - Primary   Relevant Medications   lisinopril (ZESTRIL) 40 MG tablet   Other Relevant Orders   POCT CBG (Fasting - Glucose) (Completed)   Hemoglobin A1c   Vitamin B12   Combined hyperlipidemia associated with type 2 diabetes mellitus (HCC)   Relevant Medications   lisinopril (ZESTRIL) 40 MG tablet   Other Relevant Orders   Lipid Panel w/o Chol/HDL Ratio   Hypertension associated with diabetes (HCC)    Relevant Medications   lisinopril (ZESTRIL) 40 MG tablet   Other Relevant Orders   CBC with Diff   CMP14+EGFR    Return in about 3 months (around 10/16/2024).   Total time spent: 45 minutes  FERNAND FREDY RAMAN, MD  07/16/2024   This document may have been prepared by [facility] Voice Recognition software and as such may include unintentional dictation errors.

## 2024-07-19 ENCOUNTER — Ambulatory Visit: Payer: Self-pay | Admitting: Internal Medicine

## 2024-07-19 LAB — CMP14+EGFR
ALT: 10 IU/L (ref 0–44)
AST: 18 IU/L (ref 0–40)
Albumin: 4.6 g/dL (ref 3.8–4.8)
Alkaline Phosphatase: 91 IU/L (ref 47–123)
BUN/Creatinine Ratio: 11 (ref 10–24)
BUN: 16 mg/dL (ref 8–27)
Bilirubin Total: 0.4 mg/dL (ref 0.0–1.2)
CO2: 20 mmol/L (ref 20–29)
Calcium: 10.8 mg/dL — ABNORMAL HIGH (ref 8.6–10.2)
Chloride: 103 mmol/L (ref 96–106)
Creatinine, Ser: 1.51 mg/dL — ABNORMAL HIGH (ref 0.76–1.27)
Globulin, Total: 2.7 g/dL (ref 1.5–4.5)
Glucose: 132 mg/dL — ABNORMAL HIGH (ref 70–99)
Potassium: 4.9 mmol/L (ref 3.5–5.2)
Sodium: 142 mmol/L (ref 134–144)
Total Protein: 7.3 g/dL (ref 6.0–8.5)
eGFR: 47 mL/min/1.73 — ABNORMAL LOW (ref >59)

## 2024-07-19 LAB — CBC WITH DIFFERENTIAL/PLATELET
Basophils Absolute: 0 x10E3/uL (ref 0.0–0.2)
Basos: 1 %
EOS (ABSOLUTE): 0.1 x10E3/uL (ref 0.0–0.4)
Eos: 4 %
Hematocrit: 36.6 % — ABNORMAL LOW (ref 37.5–51.0)
Hemoglobin: 11.4 g/dL — ABNORMAL LOW (ref 13.0–17.7)
Immature Grans (Abs): 0 x10E3/uL (ref 0.0–0.1)
Immature Granulocytes: 0 %
Lymphocytes Absolute: 0.9 x10E3/uL (ref 0.7–3.1)
Lymphs: 30 %
MCH: 28.8 pg (ref 26.6–33.0)
MCHC: 31.1 g/dL — ABNORMAL LOW (ref 31.5–35.7)
MCV: 92 fL (ref 79–97)
Monocytes Absolute: 0.3 x10E3/uL (ref 0.1–0.9)
Monocytes: 10 %
Neutrophils Absolute: 1.6 x10E3/uL (ref 1.4–7.0)
Neutrophils: 55 %
Platelets: 257 x10E3/uL (ref 150–450)
RBC: 3.96 x10E6/uL — ABNORMAL LOW (ref 4.14–5.80)
RDW: 13.1 % (ref 11.6–15.4)
WBC: 3 x10E3/uL — ABNORMAL LOW (ref 3.4–10.8)

## 2024-07-19 LAB — LIPID PANEL W/O CHOL/HDL RATIO
Cholesterol, Total: 151 mg/dL (ref 100–199)
HDL: 60 mg/dL (ref >39)
LDL Chol Calc (NIH): 76 mg/dL (ref 0–99)
Triglycerides: 76 mg/dL (ref 0–149)
VLDL Cholesterol Cal: 15 mg/dL (ref 5–40)

## 2024-07-19 LAB — HEMOGLOBIN A1C
Est. average glucose Bld gHb Est-mCnc: 157 mg/dL
Hgb A1c MFr Bld: 7.1 % — ABNORMAL HIGH (ref 4.8–5.6)

## 2024-07-19 LAB — VITAMIN B12: Vitamin B-12: 643 pg/mL (ref 232–1245)

## 2024-07-23 NOTE — Progress Notes (Signed)
 Patient notified

## 2024-09-04 ENCOUNTER — Other Ambulatory Visit: Payer: Self-pay

## 2024-09-04 DIAGNOSIS — Z8546 Personal history of malignant neoplasm of prostate: Secondary | ICD-10-CM

## 2024-09-05 MED ORDER — LISINOPRIL 40 MG PO TABS: 40.0000 mg | ORAL_TABLET | Freq: Every day | ORAL | 3 refills | Status: AC

## 2024-09-05 MED ORDER — METFORMIN HCL ER 500 MG PO TB24: 500.0000 mg | ORAL_TABLET | Freq: Every morning | ORAL | 2 refills | Status: AC

## 2024-10-17 ENCOUNTER — Encounter: Payer: Self-pay | Admitting: Internal Medicine

## 2024-10-17 ENCOUNTER — Ambulatory Visit: Admitting: Internal Medicine

## 2024-10-17 VITALS — BP 148/50 | HR 74 | Ht 71.0 in | Wt 144.4 lb

## 2024-10-17 DIAGNOSIS — E782 Mixed hyperlipidemia: Secondary | ICD-10-CM

## 2024-10-17 DIAGNOSIS — E1165 Type 2 diabetes mellitus with hyperglycemia: Secondary | ICD-10-CM

## 2024-10-17 DIAGNOSIS — E1169 Type 2 diabetes mellitus with other specified complication: Secondary | ICD-10-CM

## 2024-10-17 DIAGNOSIS — I152 Hypertension secondary to endocrine disorders: Secondary | ICD-10-CM | POA: Diagnosis not present

## 2024-10-17 DIAGNOSIS — D509 Iron deficiency anemia, unspecified: Secondary | ICD-10-CM | POA: Diagnosis not present

## 2024-10-17 DIAGNOSIS — N1831 Chronic kidney disease, stage 3a: Secondary | ICD-10-CM

## 2024-10-17 DIAGNOSIS — E119 Type 2 diabetes mellitus without complications: Secondary | ICD-10-CM

## 2024-10-17 DIAGNOSIS — E1159 Type 2 diabetes mellitus with other circulatory complications: Secondary | ICD-10-CM | POA: Diagnosis not present

## 2024-10-17 LAB — POCT CBG (FASTING - GLUCOSE)-MANUAL ENTRY: Glucose Fasting, POC: 222 mg/dL — AB (ref 70–99)

## 2024-10-17 MED ORDER — ATORVASTATIN CALCIUM 20 MG PO TABS: 10.0000 mg | ORAL_TABLET | Freq: Every day | ORAL | 3 refills | Status: AC

## 2024-10-17 MED ORDER — SPIRONOLACTONE 25 MG PO TABS: 25.0000 mg | ORAL_TABLET | Freq: Every day | ORAL | 11 refills | Status: DC

## 2024-10-17 NOTE — Progress Notes (Signed)
 "  Established Patient Office Visit  Subjective:  Patient ID: Donald Davis, male    DOB: 10/21/1945  Age: 79 y.o. MRN: [id number]  Chief Complaint  Patient presents with   Follow-up    3 month follow up    Patient is here today for follow up and Medicare AWV.  His BP is elevated today. His BP goal is <130/80. He reports taking his medications as prescribed around 6 am this morning. Will add on spirolactone 25 mg daily as patient has had complications with hydrochlorothiazide in the past. He is on max dose amlodipine 10 mg and max dose lisinopril  40 mg daily. Patient recently saw Nephrology 08/2024 and they recommended his Atorvastatin  be increased to 20 gm daily. Will send in new prescription of Atorvastatin  20 mg daily.  He reports feeling well and denies any complaints at this time. He is due for labs but will get them done when he comes back as he is not fasting at this time.  PHQ-9 score 0; GAD-7 score 0; 6CIT score 10. Last colonoscopy 5/21. Patient states he would like to do every 4-5 months visits if possible due to him still actively working and having to take time off from work for doctor appointments. Discussed this option in the future if BP remains well controlled and his labs are stable.    No other concerns at this time.   Past Medical History:  Diagnosis Date   Anemia    Chronic kidney disease (CKD), stage III (moderate) (HCC)    Diabetes mellitus (HCC)    Erectile dysfunction    Hypercalcemia    Hyperlipidemia    Hypertension     Past Surgical History:  Procedure Laterality Date   COLONOSCOPY WITH PROPOFOL  N/A 02/14/2020   Procedure: COLONOSCOPY WITH PROPOFOL ;  Surgeon: Jinny Carmine, MD;  Location: [facility] ENDOSCOPY;  Service: Endoscopy;  Laterality: N/A;   ESOPHAGOGASTRODUODENOSCOPY (EGD) WITH PROPOFOL  N/A 02/14/2020   Procedure: ESOPHAGOGASTRODUODENOSCOPY (EGD) WITH PROPOFOL ;  Surgeon: Jinny Carmine, MD;  Location: [facility] ENDOSCOPY;  Service: Endoscopy;   Laterality: N/A;   PROSTATE SURGERY      Social History   Socioeconomic History   Marital status: Single    Spouse name: Not on file   Number of children: Not on file   Years of education: Not on file   Highest education level: Not on file  Occupational History   Not on file  Tobacco Use   Smoking status: Never   Smokeless tobacco: Never  Vaping Use   Vaping status: Never Used  Substance and Sexual Activity   Alcohol use: Never   Drug use: Never   Sexual activity: Not on file  Other Topics Concern   Not on file  Social History Narrative   Not on file   Social Drivers of Health   Tobacco Use: Low Risk (10/17/2024)   Patient History    Smoking Tobacco Use: Never    Smokeless Tobacco Use: Never    Passive Exposure: Not on file  Financial Resource Strain: Not on file  Food Insecurity: Not on file  Transportation Needs: Not on file  Physical Activity: Not on file  Stress: Not on file  Social Connections: Not on file  Intimate Partner Violence: Not on file  Depression (EYV7-0): Low Risk (07/16/2024)   Depression (PHQ2-9)    PHQ-2 Score: 0  Alcohol Screen: Not on file  Housing: Not on file  Utilities: Not on file  Health Literacy: Not on file    Family History  Problem Relation Age of Onset   Cancer Father     Allergies[1]  Show/hide medication list[2]  Review of Systems  Constitutional: Negative.  Negative for chills, fever and malaise/fatigue.  HENT: Negative.  Negative for congestion and sore throat.   Eyes: Negative.  Negative for blurred vision and pain.  Respiratory: Negative.  Negative for cough and shortness of breath.   Cardiovascular: Negative.  Negative for chest pain, palpitations and leg swelling.  Gastrointestinal: Negative.  Negative for abdominal pain, blood in stool, constipation, diarrhea, heartburn, melena, nausea and vomiting.  Genitourinary: Negative.  Negative for dysuria, flank pain, frequency and urgency.  Musculoskeletal: Negative.   Negative for joint pain and myalgias.  Skin: Negative.   Neurological: Negative.  Negative for dizziness, tingling, sensory change, weakness and headaches.  Endo/Heme/Allergies: Negative.   Psychiatric/Behavioral: Negative.  Negative for depression and suicidal ideas. The patient is not nervous/anxious.        Objective:   BP (!) 148/50   Pulse 74   Ht 5' 11 (1.803 m)   Wt 144 lb 6.4 oz (65.5 kg)   SpO2 97%   BMI 20.14 kg/m   Vitals:   10/17/24 1432  BP: (!) 148/50  Pulse: 74  Height: 5' 11 (1.803 m)  Weight: 144 lb 6.4 oz (65.5 kg)  SpO2: 97%  BMI (Calculated): 20.15    Physical Exam Vitals and nursing note reviewed.  Constitutional:      General: He is not in acute distress.    Appearance: Normal appearance. He is not ill-appearing.  HENT:     Head: Normocephalic and atraumatic.     Nose: Nose normal.     Mouth/Throat:     Mouth: Mucous membranes are moist.     Pharynx: Oropharynx is clear.  Eyes:     Conjunctiva/sclera: Conjunctivae normal.     Pupils: Pupils are equal, round, and reactive to light.  Cardiovascular:     Rate and Rhythm: Normal rate and regular rhythm.     Pulses: Normal pulses.     Heart sounds: Normal heart sounds.  Pulmonary:     Effort: Pulmonary effort is normal.     Breath sounds: Normal breath sounds. No wheezing or rhonchi.  Abdominal:     General: Bowel sounds are normal. There is no distension.     Palpations: Abdomen is soft.     Tenderness: There is no abdominal tenderness.  Musculoskeletal:        General: Normal range of motion.     Cervical back: Normal range of motion and neck supple.     Right lower leg: No edema.     Left lower leg: No edema.  Skin:    General: Skin is warm and dry.     Capillary Refill: Capillary refill takes less than 2 seconds.  Neurological:     General: No focal deficit present.     Mental Status: He is alert and oriented to person, place, and time.     Sensory: No sensory deficit.      Motor: No weakness.  Psychiatric:        Mood and Affect: Mood normal.        Behavior: Behavior normal.        Judgment: Judgment normal.      Results for orders placed or performed in visit on 10/17/24  POCT CBG (Fasting - Glucose)  Result Value Ref Range   Glucose Fasting, POC 222 (A) 70 - 99 mg/dL    Recent  Results (from the past 2160 hours)  POCT CBG (Fasting - Glucose)     Status: Abnormal   Collection Time: 10/17/24  2:38 PM  Result Value Ref Range   Glucose Fasting, POC 222 (A) 70 - 99 mg/dL      Assessment & Plan:  Increase Atorvastatin  to 20 mg daily. Start Spirolactone 25 mg daily. Continue other medications as prescribed. Check routine labs when patient returns. Problem List Items Addressed This Visit       Cardiovascular and Mediastinum   Hypertension associated with diabetes (HCC) - Primary   Relevant Medications   atorvastatin  (LIPITOR) 20 MG tablet   spironolactone  (ALDACTONE ) 25 MG tablet   Other Relevant Orders   CMP14+EGFR   CBC with Diff     Endocrine   Diabetes (HCC)   Relevant Medications   atorvastatin  (LIPITOR) 20 MG tablet   Other Relevant Orders   POCT CBG (Fasting - Glucose) (Completed)   Vitamin B12   Hemoglobin A1c   Combined hyperlipidemia associated with type 2 diabetes mellitus (HCC)   Relevant Medications   atorvastatin  (LIPITOR) 20 MG tablet   spironolactone  (ALDACTONE ) 25 MG tablet   Other Relevant Orders   Lipid Panel w/o Chol/HDL Ratio     Genitourinary   Anemia in stage 3b chronic kidney disease (HCC)   Chronic kidney disease, stage III (moderate) (HCC)   Relevant Orders   CMP14+EGFR   CBC with Diff    Return in about 2 weeks (around 10/31/2024).   Total time spent: 30 minutes. This time includes review of previous notes and results and patient face to face interaction during today's visit.    FERNAND FREDY RAMAN, MD  10/17/2024   This document may have been prepared by [facility] Voice Recognition software and as such  may include unintentional dictation errors.     [1]  Allergies Allergen Reactions   Bermuda Grass Extract     Other reaction(s): Other (See Comments) Runny nose and itchy eyes   Hydrochlorothiazide     hypercalcemia  [2]  Outpatient Medications Prior to Visit  Medication Sig   amLODipine (NORVASC) 10 MG tablet Take 10 mg by mouth daily.   aspirin 81 MG EC tablet Take by mouth.   brimonidine (ALPHAGAN) 0.2 % ophthalmic solution Place 1 drop into the left eye 2 (two) times daily.   dorzolamide-timolol (COSOPT) 22.3-6.8 MG/ML ophthalmic solution  (Patient taking differently: Place 1 drop into both eyes 2 (two) times daily.)   JARDIANCE 10 MG TABS tablet    latanoprost (XALATAN) 0.005 % ophthalmic solution  (Patient taking differently: Place 1 drop into the right eye at bedtime.)   Latanoprostene Bunod (VYZULTA) 0.024 % SOLN Place 1 drop into the left eye at bedtime.   lisinopril  (ZESTRIL ) 40 MG tablet Take 1 tablet (40 mg total) by mouth daily.   metFORMIN  (GLUCOPHAGE -XR) 500 MG 24 hr tablet Take 1 tablet (500 mg total) by mouth every morning.   [DISCONTINUED] atorvastatin  (LIPITOR) 10 MG tablet Take 10 mg by mouth daily.   [DISCONTINUED] hydrochlorothiazide (HYDRODIURIL) 25 MG tablet  (Patient not taking: Reported on 10/17/2024)   [DISCONTINUED] lovastatin (MEVACOR) 10 MG tablet  (Patient not taking: Reported on 10/17/2024)   No facility-administered medications prior to visit.   "

## 2024-11-08 ENCOUNTER — Ambulatory Visit

## 2024-11-08 VITALS — BP 140/72 | HR 73 | Ht 71.0 in | Wt 146.8 lb

## 2024-11-08 DIAGNOSIS — I152 Hypertension secondary to endocrine disorders: Secondary | ICD-10-CM

## 2024-11-08 DIAGNOSIS — Z1211 Encounter for screening for malignant neoplasm of colon: Secondary | ICD-10-CM | POA: Insufficient documentation

## 2024-11-08 DIAGNOSIS — E1169 Type 2 diabetes mellitus with other specified complication: Secondary | ICD-10-CM

## 2024-11-08 DIAGNOSIS — E559 Vitamin D deficiency, unspecified: Secondary | ICD-10-CM | POA: Insufficient documentation

## 2024-11-08 DIAGNOSIS — D509 Iron deficiency anemia, unspecified: Secondary | ICD-10-CM | POA: Insufficient documentation

## 2024-11-08 DIAGNOSIS — E119 Type 2 diabetes mellitus without complications: Secondary | ICD-10-CM

## 2024-11-08 DIAGNOSIS — D508 Other iron deficiency anemias: Secondary | ICD-10-CM

## 2024-11-08 DIAGNOSIS — N1831 Chronic kidney disease, stage 3a: Secondary | ICD-10-CM

## 2024-11-08 LAB — POCT CBG (FASTING - GLUCOSE)-MANUAL ENTRY: Glucose Fasting, POC: 155 mg/dL — AB (ref 70–99)

## 2024-11-08 MED ORDER — AMLODIPINE BESYLATE 10 MG PO TABS: 10.0000 mg | ORAL_TABLET | Freq: Every day | ORAL | 3 refills | Status: AC

## 2024-11-08 MED ORDER — SPIRONOLACTONE 25 MG PO TABS: 25.0000 mg | ORAL_TABLET | Freq: Every day | ORAL | 3 refills | Status: AC

## 2024-11-08 NOTE — Assessment & Plan Note (Signed)
-   Check labs today - Supplementation recommended based off lab results and will notify patient at that time

## 2024-11-08 NOTE — Assessment & Plan Note (Signed)
-   GI referral ordered for colonoscopy. Previous colonoscopy 2021 had tubular adenomas.

## 2024-11-08 NOTE — Progress Notes (Signed)
 "  Established Patient Office Visit  Subjective:  Patient ID: Donald Davis, male    DOB: [birth year]  Age: 79 y.o. MRN: [id number]  Chief Complaint  Patient presents with   Follow-up    2 week follow up    Patient is here today for his follow up.  He has been feeling well since last appointment.   He does not have additional concerns to discuss today.   Patient reports BP at home was 131/70 this morning. Patient brought BP log to appointment to review; on average his BP is running 115-130's/60-70's at home since starting Spirolactone at his last visit. Reports he has not taken any of his medications yet today because of needing fasting labs done. Advised patient to take BP medication before his next appointment so we can get accurate reading in the office. Denies any medication side effects. Labs are due today.  He needs refills.   I have reviewed his active problem list, medication list, allergies, family history, social history, health maintenance, notes from last encounter, lab results for his appointment today.    Diabetic eye exam scheduled 01/2025. GI referral sent as colon cancer screening is coming due.    No other concerns at this time.   Past Medical History:  Diagnosis Date   Anemia    Chronic kidney disease (CKD), stage III (moderate) (HCC)    Diabetes mellitus (HCC)    Erectile dysfunction    Hypercalcemia    Hyperlipidemia    Hypertension     Past Surgical History:  Procedure Laterality Date   COLONOSCOPY WITH PROPOFOL  N/A 02/14/2020   Procedure: COLONOSCOPY WITH PROPOFOL ;  Surgeon: Jinny Carmine, MD;  Location: [facility] ENDOSCOPY;  Service: Endoscopy;  Laterality: N/A;   ESOPHAGOGASTRODUODENOSCOPY (EGD) WITH PROPOFOL  N/A 02/14/2020   Procedure: ESOPHAGOGASTRODUODENOSCOPY (EGD) WITH PROPOFOL ;  Surgeon: Jinny Carmine, MD;  Location: [facility] ENDOSCOPY;  Service: Endoscopy;  Laterality: N/A;   PROSTATE SURGERY      Social History   Socioeconomic History   Marital  status: Single    Spouse name: Not on file   Number of children: Not on file   Years of education: Not on file   Highest education level: Not on file  Occupational History   Not on file  Tobacco Use   Smoking status: Never   Smokeless tobacco: Never  Vaping Use   Vaping status: Never Used  Substance and Sexual Activity   Alcohol use: Never   Drug use: Never   Sexual activity: Not on file  Other Topics Concern   Not on file  Social History Narrative   Not on file   Social Drivers of Health   Tobacco Use: Low Risk (11/08/2024)   Patient History    Smoking Tobacco Use: Never    Smokeless Tobacco Use: Never    Passive Exposure: Not on file  Financial Resource Strain: Not on file  Food Insecurity: Not on file  Transportation Needs: Not on file  Physical Activity: Not on file  Stress: Not on file  Social Connections: Not on file  Intimate Partner Violence: Not on file  Depression (PHQ2-9): Low Risk (07/16/2024)   Depression (PHQ2-9)    PHQ-2 Score: 0  Alcohol Screen: Not on file  Housing: Not on file  Utilities: Not on file  Health Literacy: Not on file    Family History  Problem Relation Age of Onset   Cancer Father     Allergies[1]  Review of Systems  Constitutional:  Negative for malaise/fatigue.  HENT: Negative.    Eyes:  Negative for blurred vision and pain.  Respiratory:  Negative for cough and shortness of breath.   Cardiovascular:  Negative for chest pain, palpitations, claudication and leg swelling.  Gastrointestinal:  Negative for abdominal pain, blood in stool, constipation, diarrhea, nausea and vomiting.  Genitourinary:  Negative for dysuria, frequency and urgency.  Musculoskeletal: Negative.   Skin: Negative.   Neurological:  Negative for dizziness, tingling, sensory change and headaches.  Endo/Heme/Allergies: Negative.   Psychiatric/Behavioral: Negative.         Objective:   BP (!) 140/72   Pulse 73   Ht 5' 11 (1.803 m)   Wt 146 lb 12.8  oz (66.6 kg)   SpO2 99%   BMI 20.47 kg/m   Vitals:   11/08/24 0943  BP: (!) 140/72  Pulse: 73  Height: 5' 11 (1.803 m)  Weight: 146 lb 12.8 oz (66.6 kg)  SpO2: 99%  BMI (Calculated): 20.48    Physical Exam Vitals and nursing note reviewed.  Constitutional:      Appearance: Normal appearance.  HENT:     Head: Normocephalic.  Eyes:     Extraocular Movements: Extraocular movements intact.     Pupils: Pupils are equal, round, and reactive to light.  Cardiovascular:     Rate and Rhythm: Normal rate and regular rhythm.     Pulses: Normal pulses.     Heart sounds: Normal heart sounds. No murmur heard. Pulmonary:     Effort: Pulmonary effort is normal. No respiratory distress.     Breath sounds: Normal breath sounds.  Abdominal:     General: There is no distension.     Tenderness: There is no abdominal tenderness.  Musculoskeletal:        General: No tenderness. Normal range of motion.     Cervical back: Normal range of motion and neck supple.     Right lower leg: No edema.     Left lower leg: No edema.  Skin:    General: Skin is warm and dry.     Coloration: Skin is not jaundiced.     Findings: No erythema.  Neurological:     General: No focal deficit present.     Mental Status: He is alert and oriented to person, place, and time.  Psychiatric:        Mood and Affect: Mood normal.        Speech: Speech normal.        Behavior: Behavior is cooperative.        Cognition and Memory: Memory is not impaired.      Results for orders placed or performed in visit on 11/08/24  POCT CBG (Fasting - Glucose)  Result Value Ref Range   Glucose Fasting, POC 155 (A) 70 - 99 mg/dL    Recent Results (from the past 2160 hours)  POCT CBG (Fasting - Glucose)     Status: Abnormal   Collection Time: 10/17/24  2:38 PM  Result Value Ref Range   Glucose Fasting, POC 222 (A) 70 - 99 mg/dL  POCT CBG (Fasting - Glucose)     Status: Abnormal   Collection Time: 11/08/24  9:47 AM   Result Value Ref Range   Glucose Fasting, POC 155 (A) 70 - 99 mg/dL       Assessment & Plan:   Assessment & Plan Hypertension associated with diabetes (HCC) Type 2 diabetes mellitus without complication, without long-term current use of insulin (HCC) Combined hyperlipidemia associated with type  2 diabetes mellitus (HCC) - Reinforced healthy diet and exercise as tolerated. - Continue medications as prescribed. - Check labs today  Vitamin D deficiency Stage 3a chronic kidney disease (HCC) Other iron deficiency anemia - Check labs today - Supplementation recommended based off lab results and will notify patient at that time  Colon cancer screening - GI referral ordered for colonoscopy. Previous colonoscopy 2021 had tubular adenomas.    Return in about 3 months (around 02/05/2025).   Total time spent: 25 minutes  Oddis DELENA Cain, FNP  11/08/2024   This document may have been prepared by [facility] Voice Recognition software and as such may include unintentional dictation errors.     [1]  Allergies Allergen Reactions   Bermuda Grass Extract     Other reaction(s): Other (See Comments) Runny nose and itchy eyes   Hydrochlorothiazide     hypercalcemia   "

## 2024-11-08 NOTE — Assessment & Plan Note (Signed)
-   Reinforced healthy diet and exercise as tolerated. - Continue medications as prescribed. - Check labs today

## 2025-02-06 ENCOUNTER — Ambulatory Visit: Admitting: Internal Medicine
